# Patient Record
Sex: Female | Born: 1955 | Race: White | Hispanic: No | Marital: Married | Smoking: Never smoker
Health system: Southern US, Community
[De-identification: ages and names within clinical notes are randomized; demographics above are authoritative.]

## PROBLEM LIST (undated history)

## (undated) DIAGNOSIS — R319 Hematuria, unspecified: Secondary | ICD-10-CM

## (undated) DIAGNOSIS — S069XAA Unspecified intracranial injury with loss of consciousness status unknown, initial encounter: Secondary | ICD-10-CM

## (undated) DIAGNOSIS — S069X9A Unspecified intracranial injury with loss of consciousness of unspecified duration, initial encounter: Secondary | ICD-10-CM

## (undated) HISTORY — PX: OOPHORECTOMY: SHX86

## (undated) HISTORY — DX: Hematuria, unspecified: R31.9

## (undated) HISTORY — PX: CHOLECYSTECTOMY: SHX55

## (undated) HISTORY — PX: ABDOMINAL HYSTERECTOMY: SHX81

## (undated) HISTORY — PX: JOINT REPLACEMENT: SHX530

## (undated) HISTORY — PX: TONSILLECTOMY: SUR1361

---

## 2006-01-09 ENCOUNTER — Other Ambulatory Visit: Payer: Self-pay

## 2006-01-09 ENCOUNTER — Emergency Department: Payer: Self-pay | Admitting: Emergency Medicine

## 2007-10-22 ENCOUNTER — Ambulatory Visit: Payer: Self-pay | Admitting: Unknown Physician Specialty

## 2007-12-15 ENCOUNTER — Ambulatory Visit: Payer: Self-pay | Admitting: Gastroenterology

## 2008-07-05 ENCOUNTER — Ambulatory Visit: Payer: Self-pay | Admitting: Podiatry

## 2008-07-08 ENCOUNTER — Ambulatory Visit: Payer: Self-pay | Admitting: Podiatry

## 2009-03-17 ENCOUNTER — Emergency Department: Payer: Self-pay | Admitting: Emergency Medicine

## 2009-03-20 ENCOUNTER — Ambulatory Visit: Payer: Self-pay | Admitting: Family

## 2009-04-06 ENCOUNTER — Ambulatory Visit: Payer: Self-pay | Admitting: Unknown Physician Specialty

## 2010-04-16 ENCOUNTER — Ambulatory Visit: Payer: Self-pay | Admitting: Family Medicine

## 2011-05-09 ENCOUNTER — Ambulatory Visit: Payer: Self-pay | Admitting: Family Medicine

## 2012-07-14 ENCOUNTER — Ambulatory Visit: Payer: Self-pay

## 2014-03-31 DIAGNOSIS — S069XAA Unspecified intracranial injury with loss of consciousness status unknown, initial encounter: Secondary | ICD-10-CM

## 2014-03-31 HISTORY — DX: Unspecified intracranial injury with loss of consciousness status unknown, initial encounter: S06.9XAA

## 2014-04-12 ENCOUNTER — Emergency Department: Payer: Self-pay | Admitting: Emergency Medicine

## 2015-12-18 ENCOUNTER — Encounter: Payer: Self-pay | Admitting: *Deleted

## 2015-12-18 ENCOUNTER — Emergency Department: Payer: BLUE CROSS/BLUE SHIELD

## 2015-12-18 ENCOUNTER — Emergency Department
Admission: EM | Admit: 2015-12-18 | Discharge: 2015-12-18 | Disposition: A | Discharge status: Home / Self Care | Payer: BLUE CROSS/BLUE SHIELD | Attending: Emergency Medicine | Admitting: Emergency Medicine

## 2015-12-18 DIAGNOSIS — R197 Diarrhea, unspecified: Secondary | ICD-10-CM | POA: Diagnosis not present

## 2015-12-18 DIAGNOSIS — F1290 Cannabis use, unspecified, uncomplicated: Secondary | ICD-10-CM | POA: Diagnosis not present

## 2015-12-18 DIAGNOSIS — R51 Headache: Secondary | ICD-10-CM | POA: Diagnosis not present

## 2015-12-18 DIAGNOSIS — R1110 Vomiting, unspecified: Secondary | ICD-10-CM

## 2015-12-18 DIAGNOSIS — R112 Nausea with vomiting, unspecified: Secondary | ICD-10-CM | POA: Insufficient documentation

## 2015-12-18 HISTORY — DX: Unspecified intracranial injury with loss of consciousness of unspecified duration, initial encounter: S06.9X9A

## 2015-12-18 HISTORY — DX: Unspecified intracranial injury with loss of consciousness status unknown, initial encounter: S06.9XAA

## 2015-12-18 LAB — CBC WITH DIFFERENTIAL/PLATELET
BASOS ABS: 0.1 10*3/uL (ref 0–0.1)
BASOS PCT: 1 %
EOS ABS: 0.1 10*3/uL (ref 0–0.7)
EOS PCT: 1 %
HCT: 38.9 % (ref 35.0–47.0)
HEMOGLOBIN: 13.2 g/dL (ref 12.0–16.0)
Lymphocytes Relative: 21 %
Lymphs Abs: 1.5 10*3/uL (ref 1.0–3.6)
MCH: 28.9 pg (ref 26.0–34.0)
MCHC: 34 g/dL (ref 32.0–36.0)
MCV: 85.2 fL (ref 80.0–100.0)
Monocytes Absolute: 0.4 10*3/uL (ref 0.2–0.9)
Monocytes Relative: 6 %
NEUTROS PCT: 71 %
Neutro Abs: 5 10*3/uL (ref 1.4–6.5)
Platelets: 243 10*3/uL (ref 150–440)
RBC: 4.56 MIL/uL (ref 3.80–5.20)
RDW: 13.4 % (ref 11.5–14.5)
WBC: 7.1 10*3/uL (ref 3.6–11.0)

## 2015-12-18 LAB — COMPREHENSIVE METABOLIC PANEL
ALT: 19 U/L (ref 14–54)
AST: 18 U/L (ref 15–41)
Albumin: 4.1 g/dL (ref 3.5–5.0)
Alkaline Phosphatase: 69 U/L (ref 38–126)
Anion gap: 8 (ref 5–15)
BILIRUBIN TOTAL: 0.7 mg/dL (ref 0.3–1.2)
BUN: 9 mg/dL (ref 6–20)
CO2: 25 mmol/L (ref 22–32)
Calcium: 8.4 mg/dL — ABNORMAL LOW (ref 8.9–10.3)
Chloride: 107 mmol/L (ref 101–111)
Creatinine, Ser: 0.69 mg/dL (ref 0.44–1.00)
GFR calc Af Amer: >60 mL/min (ref >60)
Glucose, Bld: 145 mg/dL — ABNORMAL HIGH (ref 65–99)
POTASSIUM: 4 mmol/L (ref 3.5–5.1)
Sodium: 140 mmol/L (ref 135–145)
TOTAL PROTEIN: 6.8 g/dL (ref 6.5–8.1)

## 2015-12-18 LAB — LIPASE, BLOOD: LIPASE: 18 U/L (ref 11–51)

## 2015-12-18 MED ORDER — ONDANSETRON 4 MG PO TBDP: 4.0000 mg | ORAL_TABLET | Freq: Three times a day (TID) | ORAL | Status: DC | PRN

## 2015-12-18 MED ORDER — PROMETHAZINE HCL 25 MG/ML IJ SOLN
12.5000 mg | Freq: Once | INTRAMUSCULAR | Status: AC
  Administered 2015-12-18: 12.5 mg via INTRAVENOUS
  Filled 2015-12-18: qty 1

## 2015-12-18 MED ORDER — ONDANSETRON HCL 4 MG/2ML IJ SOLN
INTRAMUSCULAR | Status: AC
  Administered 2015-12-18: 8 mg
  Filled 2015-12-18: qty 4

## 2015-12-18 MED ORDER — FENTANYL CITRATE (PF) 100 MCG/2ML IJ SOLN
50.0000 ug | Freq: Once | INTRAMUSCULAR | Status: AC
  Administered 2015-12-18: 50 ug via INTRAVENOUS

## 2015-12-18 MED ORDER — SODIUM CHLORIDE 0.9 % IV BOLUS (SEPSIS)
1000.0000 mL | Freq: Once | INTRAVENOUS | Status: AC
  Administered 2015-12-18: 1000 mL via INTRAVENOUS

## 2015-12-18 MED ORDER — ONDANSETRON HCL 40 MG/20ML IJ SOLN: 8.0000 mg | Freq: Once | INTRAMUSCULAR | Status: DC

## 2015-12-18 MED ORDER — FENTANYL CITRATE (PF) 100 MCG/2ML IJ SOLN
INTRAMUSCULAR | Status: AC
  Administered 2015-12-18: 50 ug via INTRAVENOUS
  Filled 2015-12-18: qty 2

## 2015-12-18 NOTE — Discharge Instructions (Signed)

## 2015-12-18 NOTE — ED Notes (Signed)
Pt presents w/ c/o n/v/d starting after midnight tonight. Pt c/o headache. Pt states vomiting more times than could count and uncontrolled diarrhea. Pt states she started feeling nauseated after eating a salad that she purchased yesterday.

## 2015-12-18 NOTE — ED Provider Notes (Signed)
[facility] Emergency Department Provider Note  ____________________________________________  Time seen: 4:30 AM  I have reviewed the triage vital signs and the nursing notes.   HISTORY  Chief Complaint Vomiting     HPI Susan York is a 60 y.o. female presents with complaint of nausea roughly 2 hours after eating dinner last night which is persistent however no vomiting and at time patient stated that she went to bed and awoke this morning with nonbloody diarrhea with simultaneous vomiting multiple times. Patient denies any fever afebrile on presentation with a temperature 97.7. 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 She states that the headache followed the vomiting and diarrhea . Patient denies any abdominal pain     Past Medical History  Diagnosis Date  . TBI (traumatic brain injury) (HCC)     There are no active problems to display for this patient.   Past Surgical History  Procedure Laterality Date  . Cholecystectomy    . Joint replacement    . Abdominal hysterectomy    . Tonsillectomy      No current outpatient prescriptions on file.  Allergies Sulfa antibiotics  History reviewed. No pertinent family history.  Social History Social History  Substance Use Topics  . Smoking status: Never Smoker   . Smokeless tobacco: Never Used  . Alcohol Use: Yes     Comment: occasionally    Review of Systems  Constitutional: Negative for fever. Eyes: Negative for visual changes. ENT: Negative for sore throat. Cardiovascular: Negative for chest pain. Respiratory: Negative for shortness of breath. Gastrointestinal: Positive for vomiting and diarrhea Genitourinary: Negative for dysuria. Musculoskeletal: Negative for back pain. Skin: Negative for rash. Neurological: Positive for headaches   10-point ROS otherwise  negative.  ____________________________________________   PHYSICAL EXAM:  VITAL SIGNS: ED Triage Vitals  Enc Vitals Group     BP 12/18/15 0357 152/77 mmHg     Pulse Rate 12/18/15 0357 67     Resp 12/18/15 0357 16     Temp 12/18/15 0357 97.7 F (36.5 C)     Temp Source 12/18/15 0357 Oral     SpO2 12/18/15 0357 98 %     Weight 12/18/15 0357 172 lb (78.019 kg)     Height 12/18/15 0357  (1.676 m)     Head Cir --      Peak Flow --      Pain Score 12/18/15 0358 7     Pain Loc --      Pain Edu? --      Excl. in GC? --      Constitutional: Alert and oriented. Well appearing and in no distress. Eyes: Conjunctivae are normal. PERRL. Normal extraocular movements. ENT   Head: Normocephalic and atraumatic.   Nose: No congestion/rhinnorhea.   Mouth/Throat: Mucous membranes are moist.   Neck: No stridor. Hematological/Lymphatic/Immunilogical: No cervical lymphadenopathy. Cardiovascular: Normal rate, regular rhythm. Normal and symmetric distal pulses are present in all extremities. No murmurs, rubs, or gallops. Respiratory: Normal respiratory effort without tachypnea nor retractions. Breath sounds are clear and equal bilaterally. No wheezes/rales/rhonchi. Gastrointestinal: Soft and nontender. No distention. There is no CVA tenderness. Genitourinary: deferred Musculoskeletal: Nontender with normal range of motion in all extremities. No joint effusions.  No lower extremity tenderness nor edema. Neurologic:  Normal speech and language. No gross focal neurologic deficits are appreciated. Speech is normal.  Skin:  Skin  is warm, dry and intact. No rash noted. Psychiatric: Mood and affect are normal. Speech and behavior are normal. Patient exhibits appropriate insight and judgment.  ____________________________________________    LABS (pertinent positives/negatives)  Labs Reviewed  COMPREHENSIVE METABOLIC PANEL - Abnormal; Notable for the following:    Glucose, Bld 145  (*)    Calcium 8.4 (*)    All other components within normal limits  CBC WITH DIFFERENTIAL/PLATELET  LIPASE, BLOOD         RADIOLOGY   CT Head Wo Contrast (Final result) Result time: 12/18/15 05:53:24   Final result by Rad Results In Interface (12/18/15 05:53:24)   Narrative:   CLINICAL DATA: 60 year old female with persistent vomiting  EXAM: CT HEAD WITHOUT CONTRAST  TECHNIQUE: Contiguous axial images were obtained from the base of the skull through the vertex without intravenous contrast.  COMPARISON: Head CT dated 01/09/2006  FINDINGS: The ventricles and the sulci are appropriate in size for the patient's age. There is no intracranial hemorrhage. No midline shift or mass effect identified. The gray-white matter differentiation is preserved.  There is mild mucoperiosteal thickening of the paranasal sinuses. No air-fluid levels. The mastoid air cells are clear. The calvarium is intact.  IMPRESSION: No acute intracranial pathology.   Electronically Signed By: Elgie CollardArash Radparvar M.D. On: 12/18/2015 05:53        ECG Results        INITIAL IMPRESSION / ASSESSMENT AND PLAN / ED COURSE  Pertinent labs & imaging results that were available during my care of the patient were reviewed by me and considered in my medical decision making (see chart for details).  Patient received 8 mg of IV Zofran without any improvement in nausea and had one subsequent episode of vomiting. As such patient received 12.5 mg of Phenergan and on reassessment patient states that nausea was improved. Patient never had any abdominal pain during entire emergency department stay. Patient states that her headache is improved at this time as well.Given absence of abdominal pain no abdominal imaging was performed laboratory data also reassuring. Suspect possible gastroenteritis as the etiologies of patient's vomiting and diarrhea  ____________________________________________   FINAL  CLINICAL IMPRESSION(S) / ED DIAGNOSES  Final diagnoses:  Vomiting and diarrhea      Darci Currentandolph N Brown, MD 12/18/15 [phone]

## 2016-05-06 ENCOUNTER — Other Ambulatory Visit: Payer: Self-pay | Admitting: Family Medicine

## 2016-05-06 DIAGNOSIS — Z1231 Encounter for screening mammogram for malignant neoplasm of breast: Secondary | ICD-10-CM

## 2016-05-14 ENCOUNTER — Encounter (INDEPENDENT_AMBULATORY_CARE_PROVIDER_SITE_OTHER): Payer: Self-pay

## 2016-05-14 ENCOUNTER — Ambulatory Visit
Admission: RE | Admit: 2016-05-14 | Discharge: 2016-05-14 | Disposition: A | Discharge status: Home / Self Care | Payer: BLUE CROSS/BLUE SHIELD | Source: Ambulatory Visit | Attending: Family Medicine | Admitting: Family Medicine

## 2016-05-14 DIAGNOSIS — Z1231 Encounter for screening mammogram for malignant neoplasm of breast: Secondary | ICD-10-CM

## 2017-03-04 ENCOUNTER — Other Ambulatory Visit: Payer: Self-pay | Admitting: Internal Medicine

## 2017-03-04 DIAGNOSIS — Z1231 Encounter for screening mammogram for malignant neoplasm of breast: Secondary | ICD-10-CM

## 2017-06-09 ENCOUNTER — Ambulatory Visit: Payer: Medicare Other

## 2017-06-13 ENCOUNTER — Ambulatory Visit: Payer: Medicare Other

## 2017-07-17 ENCOUNTER — Other Ambulatory Visit: Payer: Self-pay | Admitting: Family Medicine

## 2017-07-17 DIAGNOSIS — Z1231 Encounter for screening mammogram for malignant neoplasm of breast: Secondary | ICD-10-CM

## 2017-07-29 ENCOUNTER — Ambulatory Visit
Admission: RE | Admit: 2017-07-29 | Discharge: 2017-07-29 | Disposition: A | Discharge status: Home / Self Care | Payer: Medicare HMO | Source: Ambulatory Visit | Attending: Family Medicine | Admitting: Family Medicine

## 2017-07-29 DIAGNOSIS — Z1231 Encounter for screening mammogram for malignant neoplasm of breast: Secondary | ICD-10-CM | POA: Diagnosis present

## 2017-08-04 ENCOUNTER — Other Ambulatory Visit: Payer: Self-pay | Admitting: Family Medicine

## 2017-08-04 DIAGNOSIS — R928 Other abnormal and inconclusive findings on diagnostic imaging of breast: Secondary | ICD-10-CM

## 2017-08-04 DIAGNOSIS — N6489 Other specified disorders of breast: Secondary | ICD-10-CM

## 2017-08-11 ENCOUNTER — Ambulatory Visit: Payer: Medicare HMO

## 2017-08-11 ENCOUNTER — Other Ambulatory Visit: Payer: Medicare HMO

## 2017-08-20 ENCOUNTER — Ambulatory Visit
Admission: RE | Admit: 2017-08-20 | Discharge: 2017-08-20 | Disposition: A | Discharge status: Home / Self Care | Payer: Medicare HMO | Source: Ambulatory Visit | Attending: Family Medicine | Admitting: Family Medicine

## 2017-08-20 DIAGNOSIS — R928 Other abnormal and inconclusive findings on diagnostic imaging of breast: Secondary | ICD-10-CM | POA: Diagnosis present

## 2017-08-20 DIAGNOSIS — N6489 Other specified disorders of breast: Secondary | ICD-10-CM

## 2017-08-20 DIAGNOSIS — N6311 Unspecified lump in the right breast, upper outer quadrant: Secondary | ICD-10-CM | POA: Diagnosis not present

## 2017-08-25 ENCOUNTER — Other Ambulatory Visit: Payer: Self-pay | Admitting: Family Medicine

## 2017-08-25 DIAGNOSIS — N6310 Unspecified lump in the right breast, unspecified quadrant: Secondary | ICD-10-CM

## 2017-08-25 DIAGNOSIS — R928 Other abnormal and inconclusive findings on diagnostic imaging of breast: Secondary | ICD-10-CM

## 2017-08-28 ENCOUNTER — Other Ambulatory Visit: Payer: Self-pay | Admitting: Diagnostic Radiology

## 2017-08-28 ENCOUNTER — Ambulatory Visit
Admission: RE | Admit: 2017-08-28 | Discharge: 2017-08-28 | Disposition: A | Discharge status: Home / Self Care | Payer: Medicare HMO | Source: Ambulatory Visit | Attending: Family Medicine | Admitting: Family Medicine

## 2017-08-28 DIAGNOSIS — N6001 Solitary cyst of right breast: Secondary | ICD-10-CM | POA: Diagnosis not present

## 2017-08-28 DIAGNOSIS — R928 Other abnormal and inconclusive findings on diagnostic imaging of breast: Secondary | ICD-10-CM

## 2017-08-28 DIAGNOSIS — N6311 Unspecified lump in the right breast, upper outer quadrant: Secondary | ICD-10-CM | POA: Insufficient documentation

## 2017-08-28 DIAGNOSIS — N6310 Unspecified lump in the right breast, unspecified quadrant: Secondary | ICD-10-CM

## 2017-08-28 HISTORY — PX: BREAST BIOPSY: SHX20

## 2017-09-01 LAB — SURGICAL PATHOLOGY

## 2018-08-06 ENCOUNTER — Other Ambulatory Visit: Payer: Self-pay | Admitting: Family Medicine

## 2018-08-06 DIAGNOSIS — Z1231 Encounter for screening mammogram for malignant neoplasm of breast: Secondary | ICD-10-CM

## 2018-08-20 ENCOUNTER — Ambulatory Visit
Admission: RE | Admit: 2018-08-20 | Discharge: 2018-08-20 | Disposition: A | Discharge status: Home / Self Care | Payer: Medicare HMO | Source: Ambulatory Visit | Attending: Family Medicine | Admitting: Family Medicine

## 2018-08-20 DIAGNOSIS — Z1231 Encounter for screening mammogram for malignant neoplasm of breast: Secondary | ICD-10-CM | POA: Diagnosis not present

## 2019-05-06 IMAGING — MG MM DIGITAL DIAGNOSTIC UNILAT*R* W/ TOMO W/ CAD
6 of 9 series · 6 of 21 positions shown · non-contrast
Comparison: Previous exam(s).

CLINICAL DATA: Right breast upper outer quadrant possible nodule
seen on most recent screening mammography.

EXAM:
DIGITAL DIAGNOSTIC RIGHT MAMMOGRAM WITH CAD AND TOMO
ULTRASOUND RIGHT BREAST

[R MLO synth-2D]
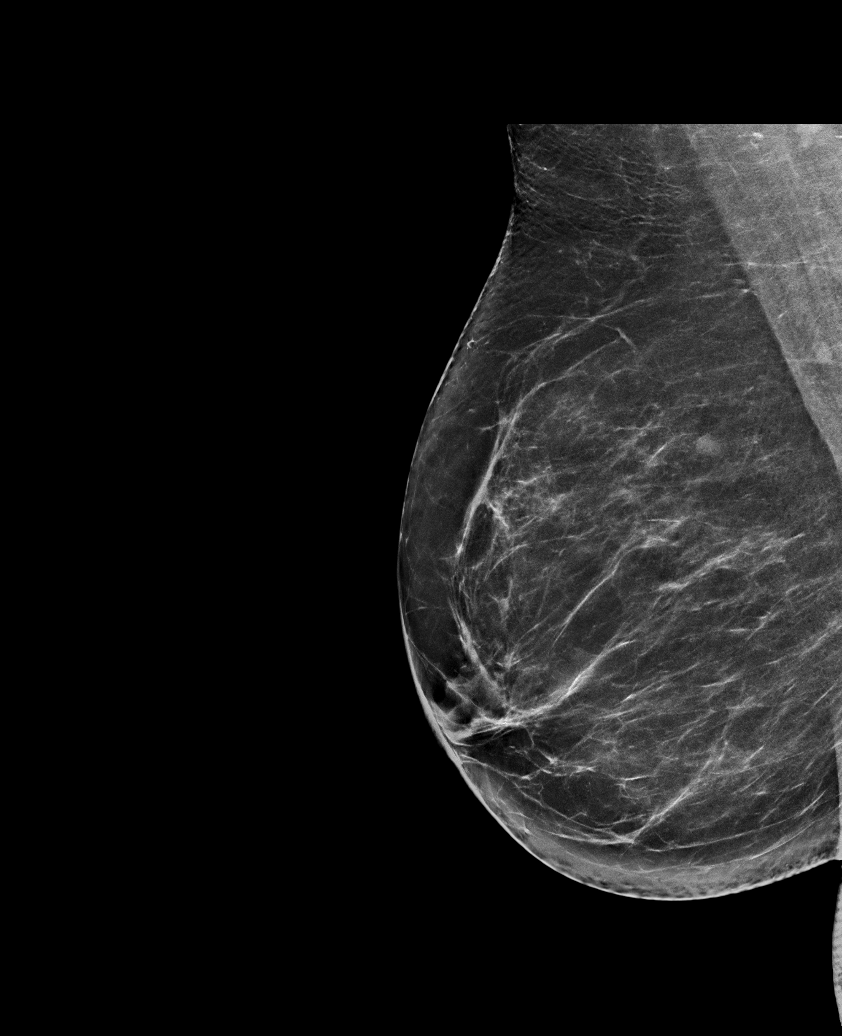

[R XCCL]
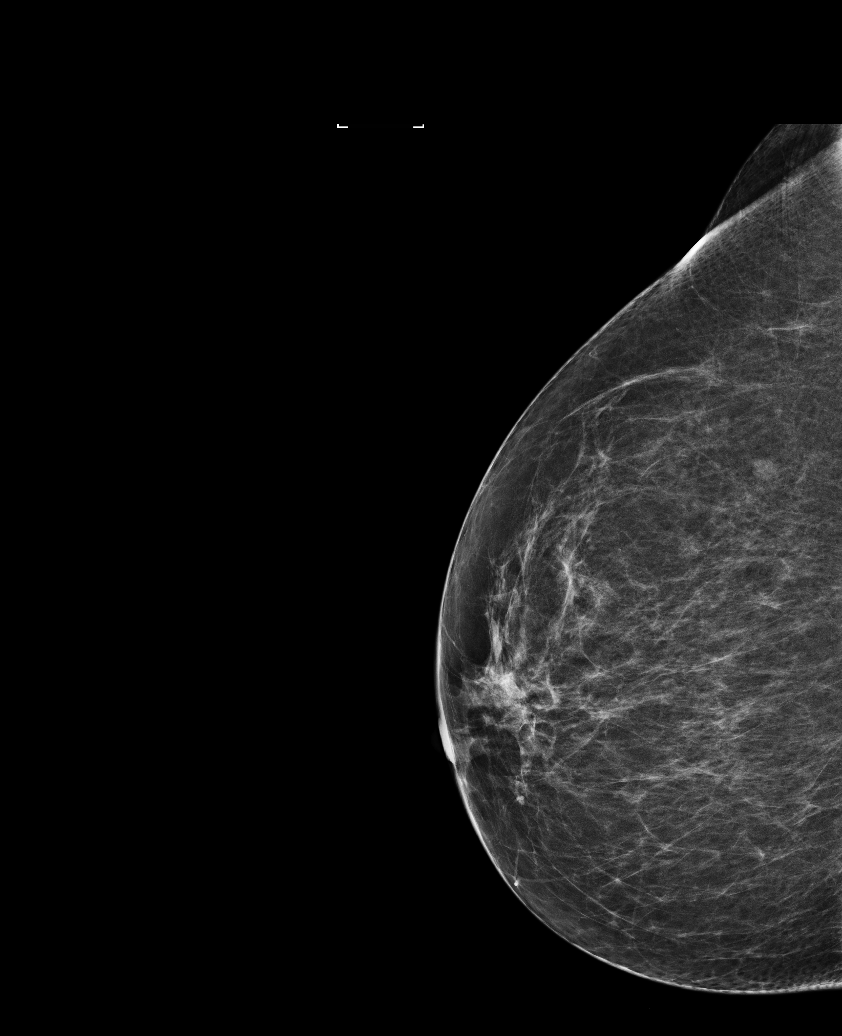

[R MLO]
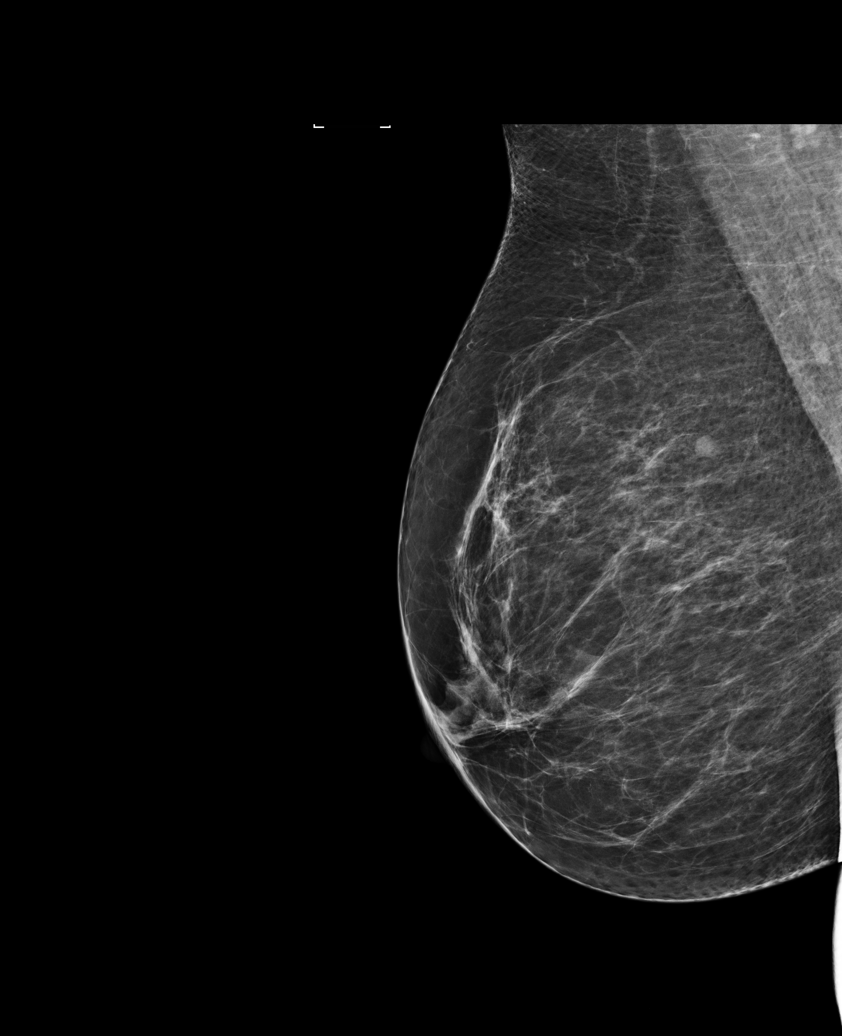

[R XCCL synth-2D]
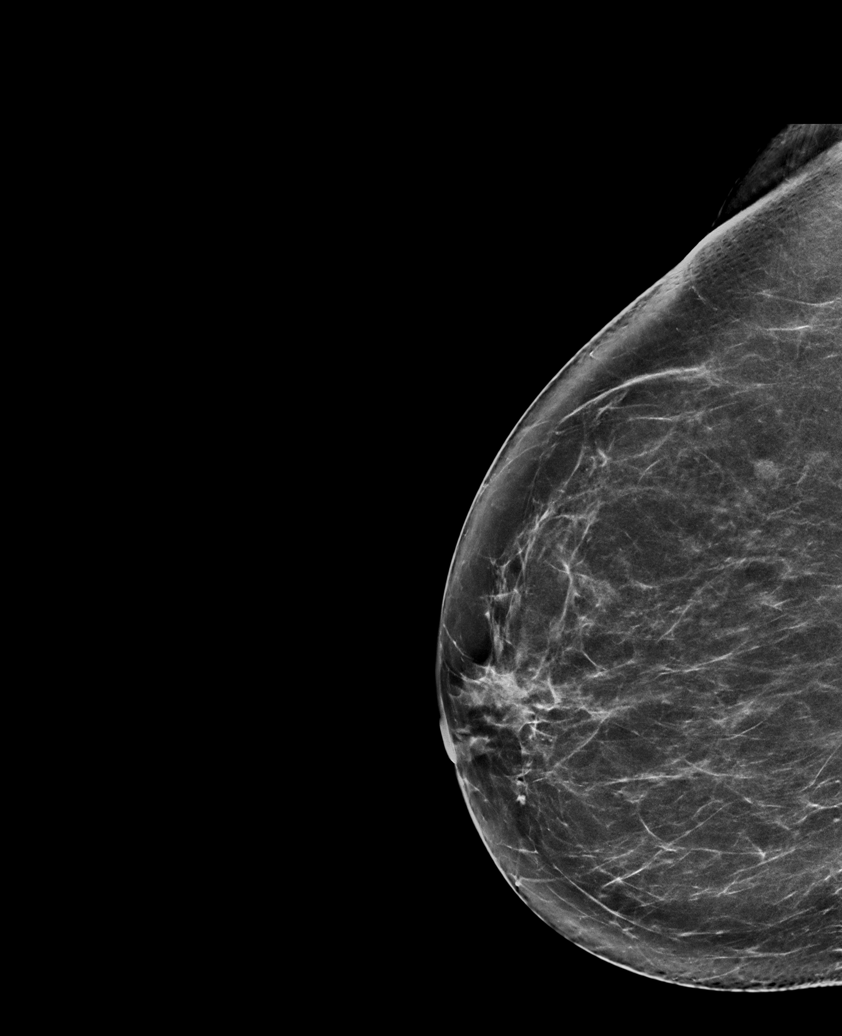

[R CC synth-2D]
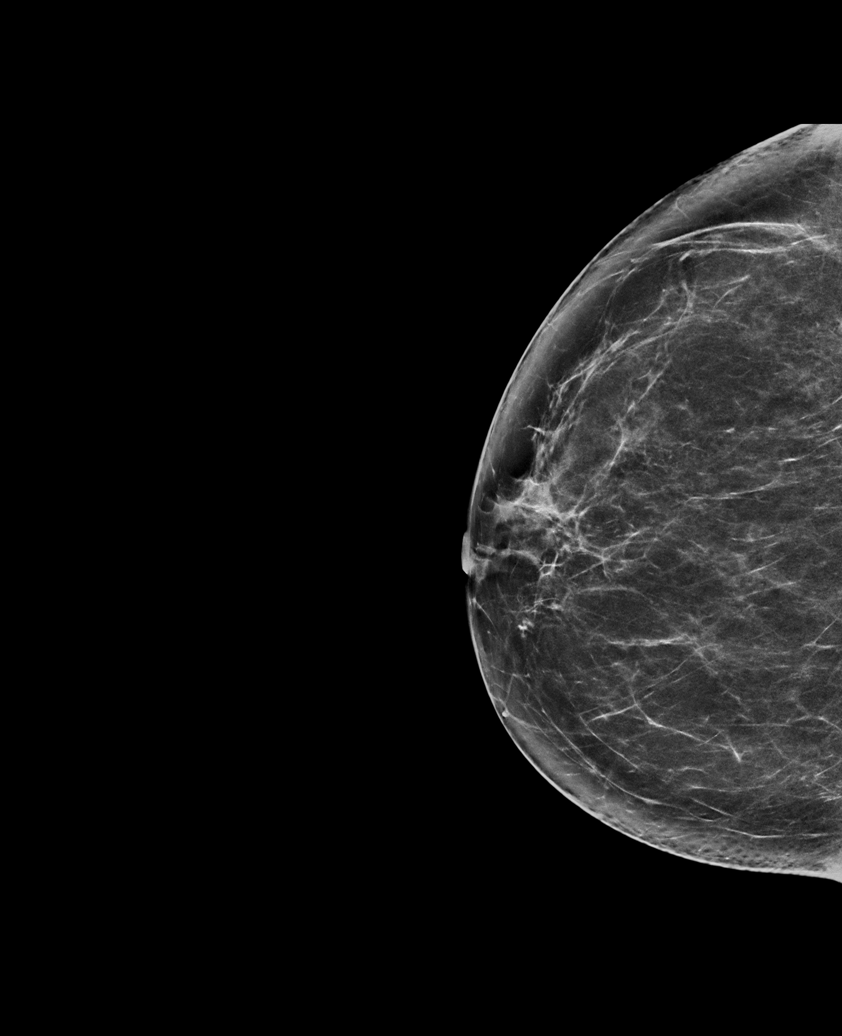

[R CC]
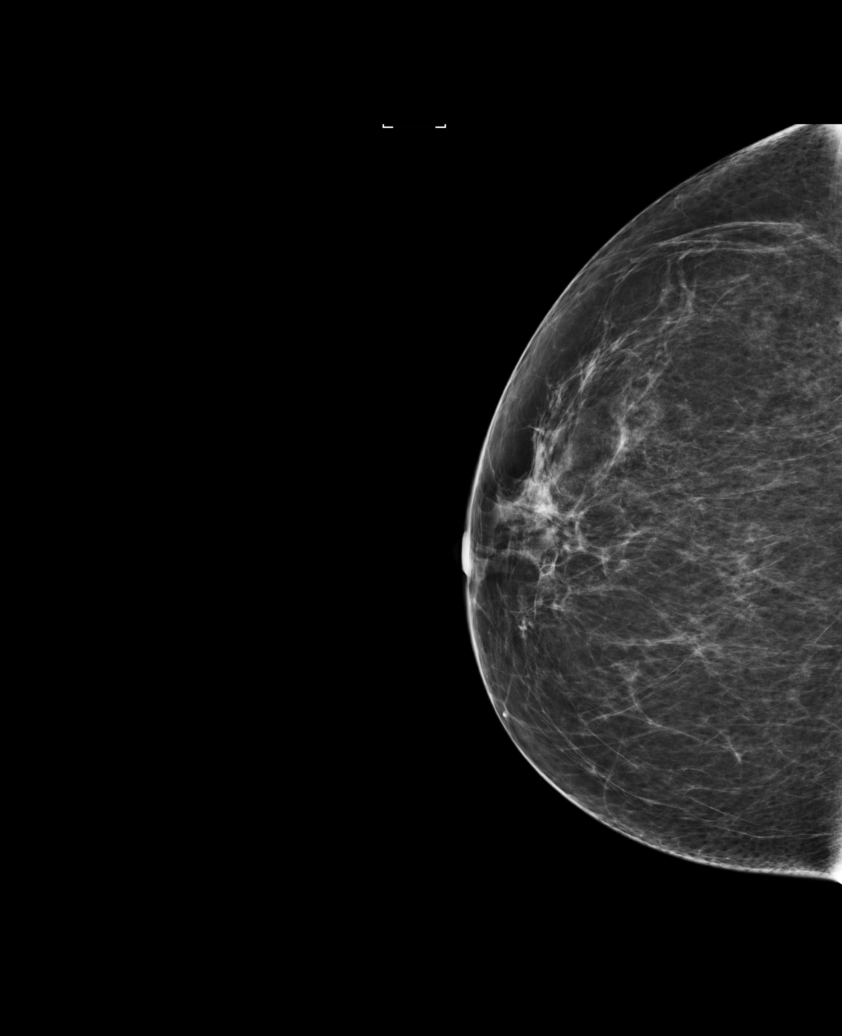

[6 of 21 positions shown; findings below may reference images not displayed]

ACR Breast Density Category b: There are scattered areas of
fibroglandular density.
FINDINGS: Additional mammographic views of the right breast demonstrate
persistent 7 mm circumscribed nodule in the right breast upper outer
quadrant, posterior depth.

Mammographic images were processed with CAD.

On physical exam, no suspicious masses are palpated.

Targeted ultrasound is performed, showing right breast 10 o'clock 9
cm from the nipple hypoechoic slightly taller than wide nodule
measuring 0.6 x 0.4 by 0.4 cm. This finding corresponds to the
mammographically seen nodule. No evidence of right axillary
lymphadenopathy.
IMPRESSION: Right breast 10 o'clock 6 mm indeterminate nodule, for which
ultrasound-guided core needle biopsy is recommended.

RECOMMENDATION:
Ultrasound-guided core needle biopsy of the right breast.

I have discussed the findings and recommendations with the patient.
Results were also provided in writing at the conclusion of the
visit. If applicable, a reminder letter will be sent to the patient
regarding the next appointment.

BI-RADS CATEGORY  4: Suspicious.

## 2019-08-16 ENCOUNTER — Other Ambulatory Visit: Payer: Self-pay | Admitting: Hematology and Oncology

## 2019-08-16 DIAGNOSIS — Z1231 Encounter for screening mammogram for malignant neoplasm of breast: Secondary | ICD-10-CM

## 2019-09-13 ENCOUNTER — Other Ambulatory Visit: Payer: Self-pay

## 2019-09-13 ENCOUNTER — Ambulatory Visit
Admission: RE | Admit: 2019-09-13 | Discharge: 2019-09-13 | Disposition: A | Discharge status: Home / Self Care | Payer: Medicare HMO | Source: Ambulatory Visit | Attending: Hematology and Oncology | Admitting: Hematology and Oncology

## 2019-09-13 DIAGNOSIS — Z1231 Encounter for screening mammogram for malignant neoplasm of breast: Secondary | ICD-10-CM

## 2020-09-19 ENCOUNTER — Other Ambulatory Visit: Payer: Self-pay | Admitting: Family Medicine

## 2020-09-19 DIAGNOSIS — Z1231 Encounter for screening mammogram for malignant neoplasm of breast: Secondary | ICD-10-CM

## 2020-09-26 ENCOUNTER — Ambulatory Visit: Payer: Medicare HMO

## 2020-10-02 ENCOUNTER — Other Ambulatory Visit: Payer: Self-pay

## 2020-10-02 ENCOUNTER — Ambulatory Visit
Admission: RE | Admit: 2020-10-02 | Discharge: 2020-10-02 | Disposition: A | Discharge status: Home / Self Care | Payer: Medicare HMO | Source: Ambulatory Visit | Attending: Family Medicine | Admitting: Family Medicine

## 2020-10-02 DIAGNOSIS — Z1231 Encounter for screening mammogram for malignant neoplasm of breast: Secondary | ICD-10-CM | POA: Insufficient documentation

## 2021-09-10 ENCOUNTER — Other Ambulatory Visit: Payer: Self-pay | Admitting: Family Medicine

## 2021-09-10 DIAGNOSIS — Z1231 Encounter for screening mammogram for malignant neoplasm of breast: Secondary | ICD-10-CM

## 2021-10-18 ENCOUNTER — Ambulatory Visit
Admission: RE | Admit: 2021-10-18 | Discharge: 2021-10-18 | Disposition: A | Discharge status: Home / Self Care | Payer: Medicare HMO | Source: Ambulatory Visit | Attending: Family Medicine | Admitting: Family Medicine

## 2021-10-18 DIAGNOSIS — Z1231 Encounter for screening mammogram for malignant neoplasm of breast: Secondary | ICD-10-CM

## 2021-11-21 ENCOUNTER — Encounter: Payer: Self-pay | Admitting: Gastroenterology

## 2021-11-21 ENCOUNTER — Other Ambulatory Visit: Payer: Self-pay

## 2021-11-21 DIAGNOSIS — Z86010 Personal history of colonic polyps: Secondary | ICD-10-CM

## 2021-11-21 MED ORDER — NA SULFATE-K SULFATE-MG SULF 17.5-3.13-1.6 GM/177ML PO SOLN
1.0000 | Freq: Once | ORAL | 0 refills | Status: AC
Start: 2021-11-21 — End: 2021-11-21

## 2021-11-21 NOTE — Progress Notes (Signed)
Gastroenterology Pre-Procedure Review  Request Date: 12/03/2021 Requesting Physician: Dr. Servando Snare  PATIENT REVIEW QUESTIONS: The patient responded to the following health history questions as indicated:    1. Are you having any GI issues? no 2. Do you have a personal history of Polyps? yes (last colonoscopy y) 3. Do you have a family history of Colon Cancer or Polyps? no 4. Diabetes Mellitus? no 5. Joint replacements in the past 12 months?no 6. Major health problems in the past 3 months?no 7. Any artificial heart valves, MVP, or defibrillator?no    MEDICATIONS & ALLERGIES:    Patient reports the following regarding taking any anticoagulation/antiplatelet therapy:   Plavix, Coumadin, Eliquis, Xarelto, Lovenox, Pradaxa, Brilinta, or Effient? no Aspirin? no  Patient confirms/reports the following medications:  Current Outpatient Medications  Medication Sig Dispense Refill   ondansetron (ZOFRAN ODT) 4 MG disintegrating tablet Take 1 tablet (4 mg total) by mouth every 8 (eight) hours as needed for nausea or vomiting. 20 tablet 0   No current facility-administered medications for this visit.    Patient confirms/reports the following allergies:  Allergies  Allergen Reactions   Sulfa Antibiotics Anaphylaxis    No orders of the defined types were placed in this encounter.   AUTHORIZATION INFORMATION Primary Insurance: 1D#: Group #:  Secondary Insurance: 1D#: Group #:  SCHEDULE INFORMATION: Date: 12/03/2021 Time: Location:[facility]

## 2021-11-27 ENCOUNTER — Telehealth: Payer: Self-pay

## 2021-11-27 MED ORDER — PEG 3350-KCL-NA BICARB-NACL 420 G PO SOLR: 4000.0000 mL | Freq: Once | ORAL | 0 refills | Status: AC

## 2021-11-27 NOTE — Telephone Encounter (Signed)
Resent in different prep to pharmacy  and called patient and let her know it was sent in

## 2021-12-03 ENCOUNTER — Encounter: Payer: Self-pay | Admitting: Gastroenterology

## 2021-12-03 ENCOUNTER — Ambulatory Visit: Payer: Medicare HMO | Admitting: Anesthesiology

## 2021-12-03 ENCOUNTER — Other Ambulatory Visit: Payer: Self-pay

## 2021-12-03 ENCOUNTER — Encounter
Admission: RE | Disposition: A | Discharge status: Home / Self Care | Payer: Self-pay | Source: Home / Self Care | Attending: Gastroenterology

## 2021-12-03 ENCOUNTER — Ambulatory Visit
Admission: RE | Admit: 2021-12-03 | Discharge: 2021-12-03 | Disposition: A | Discharge status: Home / Self Care | Payer: Medicare HMO | Attending: Gastroenterology | Admitting: Gastroenterology

## 2021-12-03 DIAGNOSIS — K640 First degree hemorrhoids: Secondary | ICD-10-CM | POA: Insufficient documentation

## 2021-12-03 DIAGNOSIS — Z860100 Personal history of colon polyps, unspecified: Secondary | ICD-10-CM

## 2021-12-03 DIAGNOSIS — Z1211 Encounter for screening for malignant neoplasm of colon: Secondary | ICD-10-CM | POA: Insufficient documentation

## 2021-12-03 HISTORY — PX: COLONOSCOPY WITH PROPOFOL: SHX5780

## 2021-12-03 SURGERY — COLONOSCOPY WITH PROPOFOL
Anesthesia: General

## 2021-12-03 MED ORDER — LIDOCAINE HCL (CARDIAC) PF 100 MG/5ML IV SOSY
PREFILLED_SYRINGE | INTRAVENOUS | Status: DC | PRN
  Administered 2021-12-03: 40 mg via INTRAVENOUS

## 2021-12-03 MED ORDER — PROPOFOL 10 MG/ML IV BOLUS
INTRAVENOUS | Status: DC | PRN
  Administered 2021-12-03: 90 mg via INTRAVENOUS

## 2021-12-03 MED ORDER — SODIUM CHLORIDE 0.9 % IV SOLN: INTRAVENOUS | Status: DC

## 2021-12-03 MED ORDER — PROPOFOL 500 MG/50ML IV EMUL
INTRAVENOUS | Status: DC | PRN
  Administered 2021-12-03: 150 ug/kg/min via INTRAVENOUS

## 2021-12-03 NOTE — Transfer of Care (Signed)
Immediate Anesthesia Transfer of Care Note  Patient: Susan York  Procedure(s) Performed: Procedure(s): COLONOSCOPY WITH PROPOFOL (N/A)  Patient Location: PACU and Endoscopy Unit  Anesthesia Type:General  Level of Consciousness: sedated  Airway & Oxygen Therapy: Patient Spontanous Breathing and Patient connected to nasal cannula oxygen  Post-op Assessment: Report given to RN and Post -op Vital signs reviewed and stable  Post vital signs: Reviewed and stable  Last Vitals:  Vitals:   12/03/21 1022 12/03/21 1130  BP: 130/76 127/66  Pulse: 87   Resp: 20   Temp: (!) 35.9 C (!) 35.9 C  SpO2: 99% 99%    Complications: No apparent anesthesia complications

## 2021-12-03 NOTE — Op Note (Signed)
[facility] Gastroenterology Patient Name: Susan ParadiseSheila York Procedure Date: 12/03/2021 11:06 AM MRN: [id number] Account #: [id number] Date of Birth: 10/27/1955 Admit Type: Outpatient Age: [age] [facility] Gender: Female Note Status: Finalized Instrument Name: Nelda MarseilleColonscope [id number] Procedure:             Colonoscopy Indications:           High risk colon cancer surveillance: Personal history                         of colonic polyps Providers:             Midge Miniumarren Candace Begue MD, MD Referring MD:          Troy SineANNE ROBISON MD Medicines:             Propofol per Anesthesia Complications:         No immediate complications. Procedure:             Pre-Anesthesia Assessment:                        - Prior to the procedure, a History and Physical was                         performed, and patient medications and allergies were                         reviewed. The patient's tolerance of previous                         anesthesia was also reviewed. The risks and benefits                         of the procedure and the sedation options and risks                         were discussed with the patient. All questions were                         answered, and informed consent was obtained. Prior                         Anticoagulants: The patient has taken no previous                         anticoagulant or antiplatelet agents. ASA Grade                         Assessment: II - A patient with mild systemic disease.                         After reviewing the risks and benefits, the patient                         was deemed in satisfactory condition to undergo the                         procedure.  After obtaining informed consent, the colonoscope was                         passed under direct vision. Throughout the procedure,                         the patient's blood pressure, pulse, and oxygen                         saturations were monitored continuously.  The                         Colonoscope was introduced through the anus and                         advanced to the the cecum, identified by appendiceal                         orifice and ileocecal valve. The colonoscopy was                         performed without difficulty. The patient tolerated                         the procedure well. The quality of the bowel                         preparation was excellent. Findings:      The perianal and digital rectal examinations were normal.      Non-bleeding internal hemorrhoids were found during retroflexion. The       hemorrhoids were Grade I (internal hemorrhoids that do not prolapse). Impression:            - Non-bleeding internal hemorrhoids.                        - No specimens collected. Recommendation:        - Discharge patient to home.                        - Resume previous diet.                        - Repeat colonoscopy in 7 years for surveillance. Procedure Code(s):     --- Professional ---                        [phone], Colonoscopy, flexible; diagnostic, including                         collection of specimen(s) by brushing or washing, when                         performed (separate procedure) Diagnosis Code(s):     --- Professional ---                        Z86.010, Personal history of colonic polyps CPT copyright 2019 American Medical Association. All rights reserved. The codes documented in this report are preliminary and upon coder review may  be revised to meet  current compliance requirements. Midge Minium MD, MD 12/03/2021 11:27:22 AM This report has been signed electronically. Number of Addenda: 0 Note Initiated On: 12/03/2021 11:06 AM Scope Withdrawal Time: 0 hours 8 minutes 48 seconds  Total Procedure Duration: 0 hours 14 minutes 14 seconds  Estimated Blood Loss:  Estimated blood loss: none.      [facility]

## 2021-12-03 NOTE — Anesthesia Procedure Notes (Signed)
Date/Time: 12/03/2021 11:09 AM Performed by: Stormy Fabian, CRNA Pre-anesthesia Checklist: Patient identified, Emergency Drugs available, Suction available and Patient being monitored Patient Re-evaluated:Patient Re-evaluated prior to induction Oxygen Delivery Method: Nasal cannula Induction Type: IV induction Dental Injury: Teeth and Oropharynx as per pre-operative assessment  Comments: Nasal cannula with etCO2 monitoring
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
## 2021-12-04 ENCOUNTER — Encounter: Payer: Self-pay | Admitting: Gastroenterology

## 2021-12-04 NOTE — Anesthesia Postprocedure Evaluation (Signed)
Anesthesia Post Note  Patient: Susan York  Procedure(s) Performed: COLONOSCOPY WITH PROPOFOL  Patient location during evaluation: PACU Anesthesia Type: General Level of consciousness: awake and alert Pain management: pain level controlled Vital Signs Assessment: post-procedure vital signs reviewed and stable Respiratory status: spontaneous breathing, nonlabored ventilation, respiratory function stable and patient connected to nasal cannula oxygen Cardiovascular status: blood pressure returned to baseline and stable Postop Assessment: no apparent nausea or vomiting Anesthetic complications: no   No notable events documented.   Last Vitals:  Vitals:   12/03/21 1130 12/03/21 1150  BP: 127/66 (!) 134/57  Pulse:  74  Resp:  (!) 21  Temp: (!) 35.9 C   SpO2: 99% 99%    Last Pain:  Vitals:   12/03/21 1150  TempSrc:   PainSc: 0-No pain                 Molli Barrows

## 2022-05-09 ENCOUNTER — Ambulatory Visit (INDEPENDENT_AMBULATORY_CARE_PROVIDER_SITE_OTHER): Payer: Medicare HMO

## 2022-05-09 ENCOUNTER — Encounter: Payer: Self-pay | Admitting: Emergency Medicine

## 2022-05-09 ENCOUNTER — Ambulatory Visit
Admission: EM | Admit: 2022-05-09 | Discharge: 2022-05-09 | Disposition: A | Discharge status: Home / Self Care | Payer: Medicare HMO | Attending: Family Medicine | Admitting: Family Medicine

## 2022-05-09 DIAGNOSIS — K2900 Acute gastritis without bleeding: Secondary | ICD-10-CM | POA: Insufficient documentation

## 2022-05-09 DIAGNOSIS — R112 Nausea with vomiting, unspecified: Secondary | ICD-10-CM

## 2022-05-09 LAB — CBC WITH DIFFERENTIAL/PLATELET
Abs Immature Granulocytes: 0.03 10*3/uL (ref 0.00–0.07)
Basophils Absolute: 0.1 10*3/uL (ref 0.0–0.1)
Basophils Relative: 1 %
Eosinophils Absolute: 0.1 10*3/uL (ref 0.0–0.5)
Eosinophils Relative: 1 %
HCT: 42.2 % (ref 36.0–46.0)
Hemoglobin: 13.9 g/dL (ref 12.0–15.0)
Immature Granulocytes: 0 %
Lymphocytes Relative: 29 %
Lymphs Abs: 2.5 10*3/uL (ref 0.7–4.0)
MCH: 29 pg (ref 26.0–34.0)
MCHC: 32.9 g/dL (ref 30.0–36.0)
MCV: 87.9 fL (ref 80.0–100.0)
Monocytes Absolute: 0.5 10*3/uL (ref 0.1–1.0)
Monocytes Relative: 6 %
Neutro Abs: 5.5 10*3/uL (ref 1.7–7.7)
Neutrophils Relative %: 63 %
Platelets: 324 10*3/uL (ref 150–400)
RBC: 4.8 MIL/uL (ref 3.87–5.11)
RDW: 12.8 % (ref 11.5–15.5)
WBC: 8.6 10*3/uL (ref 4.0–10.5)
nRBC: 0 % (ref 0.0–0.2)

## 2022-05-09 LAB — COMPREHENSIVE METABOLIC PANEL
ALT: 14 U/L (ref 0–44)
AST: 17 U/L (ref 15–41)
Albumin: 4 g/dL (ref 3.5–5.0)
Alkaline Phosphatase: 78 U/L (ref 38–126)
Anion gap: 5 (ref 5–15)
BUN: 15 mg/dL (ref 8–23)
CO2: 29 mmol/L (ref 22–32)
Calcium: 8.8 mg/dL — ABNORMAL LOW (ref 8.9–10.3)
Chloride: 107 mmol/L (ref 98–111)
Creatinine, Ser: 0.88 mg/dL (ref 0.44–1.00)
GFR, Estimated: >60 mL/min (ref >60)
Glucose, Bld: 94 mg/dL (ref 70–99)
Potassium: 4.6 mmol/L (ref 3.5–5.1)
Sodium: 141 mmol/L (ref 135–145)
Total Bilirubin: 0.5 mg/dL (ref 0.3–1.2)
Total Protein: 6.9 g/dL (ref 6.5–8.1)

## 2022-05-09 LAB — LIPASE, BLOOD: Lipase: 112 U/L — ABNORMAL HIGH (ref 11–51)

## 2022-05-09 MED ORDER — PROMETHAZINE HCL 25 MG/ML IJ SOLN
12.5000 mg | Freq: Once | INTRAMUSCULAR | Status: AC
  Administered 2022-05-09: 12.5 mg via INTRAMUSCULAR

## 2022-05-09 MED ORDER — IOHEXOL 300 MG/ML  SOLN
100.0000 mL | Freq: Once | INTRAMUSCULAR | Status: AC | PRN
Start: 2022-05-09 — End: 2022-05-09
  Administered 2022-05-09: 100 mL via INTRAVENOUS

## 2022-05-09 MED ORDER — PROMETHAZINE HCL 12.5 MG PO TABS: 25.0000 mg | ORAL_TABLET | Freq: Four times a day (QID) | ORAL | 0 refills | Status: DC | PRN

## 2022-05-09 NOTE — ED Triage Notes (Signed)
Pt c/o nausea and intermittent vomiting x 1 week. She also developed lower back pain and denies any urinary symptoms x 1 week.
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
## 2022-05-09 NOTE — Discharge Instructions (Addendum)
Your CT showed evidence of inflammation of your stomach.  This is likely the cause of your persistent nausea.  I would drink clear liquids over the next 1 to 2 days.  Gradually increase food items in your diet as tolerated by your nausea pain.  Go to ED for red flag symptoms, including; fevers you cannot reduce with Tylenol/Motrin, severe headaches, vision changes, numbness/weakness in part of the body, lethargy, confusion, intractable vomiting, severe dehydration, chest pain, breathing difficulty, severe persistent abdominal or pelvic pain, signs of severe infection (increased redness, swelling of an area), feeling faint or passing out, dizziness, etc. You should especially go to the ED for sudden acute worsening of condition if you do not elect to go at this time.

## 2022-06-04 ENCOUNTER — Ambulatory Visit: Payer: Medicare HMO | Admitting: Urology

## 2022-06-04 ENCOUNTER — Encounter: Payer: Self-pay | Admitting: Urology

## 2022-06-04 ENCOUNTER — Other Ambulatory Visit: Payer: Self-pay | Admitting: *Deleted

## 2022-06-04 ENCOUNTER — Other Ambulatory Visit
Admission: RE | Admit: 2022-06-04 | Discharge: 2022-06-04 | Disposition: A | Discharge status: Home / Self Care | Payer: Medicare HMO | Attending: Urology | Admitting: Urology

## 2022-06-04 VITALS — BP 137/83 | HR 80 | Ht 66.0 in | Wt 164.0 lb

## 2022-06-04 DIAGNOSIS — R319 Hematuria, unspecified: Secondary | ICD-10-CM | POA: Insufficient documentation

## 2022-06-04 DIAGNOSIS — R3121 Asymptomatic microscopic hematuria: Secondary | ICD-10-CM

## 2022-06-04 LAB — URINALYSIS, COMPLETE (UACMP) WITH MICROSCOPIC
Glucose, UA: NEGATIVE mg/dL
Ketones, ur: 15 mg/dL — AB
Nitrite: NEGATIVE
Protein, ur: 30 mg/dL — AB
Specific Gravity, Urine: 1.025 (ref 1.005–1.030)
pH: 5.5 (ref 5.0–8.0)

## 2022-06-04 NOTE — Patient Instructions (Signed)
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
## 2022-06-05 LAB — URINE CULTURE: Culture: NO GROWTH

## 2022-06-11 ENCOUNTER — Other Ambulatory Visit: Payer: Self-pay

## 2022-06-11 ENCOUNTER — Other Ambulatory Visit: Payer: Medicare HMO | Admitting: Urology

## 2022-06-11 ENCOUNTER — Other Ambulatory Visit: Payer: Self-pay | Admitting: Urology

## 2022-06-11 DIAGNOSIS — F418 Other specified anxiety disorders: Secondary | ICD-10-CM

## 2022-06-11 MED ORDER — DIAZEPAM 5 MG PO TABS: 5.0000 mg | ORAL_TABLET | Freq: Once | ORAL | 0 refills | Status: AC | PRN

## 2022-06-12 ENCOUNTER — Telehealth: Payer: Self-pay | Admitting: Urology
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
## 2022-06-14 NOTE — Telephone Encounter (Signed)
Patient needs to contact her PCP to schedule f/u for anxiety.

## 2022-06-18 ENCOUNTER — Encounter: Payer: Self-pay | Admitting: Urology

## 2022-06-18 ENCOUNTER — Ambulatory Visit: Payer: Medicare HMO | Admitting: Urology

## 2022-06-18 VITALS — BP 148/79 | HR 64 | Ht 66.0 in | Wt 164.0 lb

## 2022-06-18 DIAGNOSIS — R3121 Asymptomatic microscopic hematuria: Secondary | ICD-10-CM

## 2022-06-18 MED ORDER — LIDOCAINE HCL URETHRAL/MUCOSAL 2 % EX GEL
1.0000 | Freq: Once | CUTANEOUS | Status: AC
  Administered 2022-06-18: 1 via URETHRAL

## 2022-06-18 NOTE — Addendum Note (Signed)
Addended by: Frankey Shown on: 06/18/2022 09:42 AM   Modules accepted: Orders

## 2022-06-18 NOTE — Progress Notes (Signed)
Cystoscopy Procedure Note:  Indication: Microscopic hematuria  After informed consent and discussion of the procedure and its risks, Susan York was positioned and prepped in the standard fashion. Cystoscopy was performed with a flexible cystoscope. The urethra, bladder neck and entire bladder was visualized in a standard fashion. The ureteral orifices were visualized in their normal location and orientation.  Bladder mucosa grossly normal throughout, no abnormalities, no abnormalities on retroflexion.  Imaging: CT abdomen and pelvis with contrast dated 05/09/2022 with no urologic abnormalities  Findings: Normal cystoscopy  Assessment and Plan: Follow-up with urology as needed  Legrand Rams, MD 06/18/2022

## 2022-08-28 ENCOUNTER — Other Ambulatory Visit: Payer: Self-pay | Admitting: Student

## 2022-08-28 DIAGNOSIS — R42 Dizziness and giddiness: Secondary | ICD-10-CM

## 2022-09-12 ENCOUNTER — Ambulatory Visit
Admission: RE | Admit: 2022-09-12 | Discharge: 2022-09-12 | Disposition: A | Discharge status: Home / Self Care | Payer: Medicare HMO | Source: Ambulatory Visit | Attending: Student | Admitting: Student

## 2022-09-12 DIAGNOSIS — R42 Dizziness and giddiness: Secondary | ICD-10-CM

## 2022-09-12 MED ORDER — GADOPICLENOL 0.5 MMOL/ML IV SOLN
7.5000 mL | Freq: Once | INTRAVENOUS | Status: AC | PRN
  Administered 2022-09-12: 7.5 mL via INTRAVENOUS

## 2022-10-17 ENCOUNTER — Other Ambulatory Visit: Payer: Self-pay | Admitting: Family Medicine

## 2022-10-17 DIAGNOSIS — Z1231 Encounter for screening mammogram for malignant neoplasm of breast: Secondary | ICD-10-CM

## 2022-11-06 ENCOUNTER — Ambulatory Visit
Admission: RE | Admit: 2022-11-06 | Discharge: 2022-11-06 | Disposition: A | Discharge status: Home / Self Care | Payer: Medicare HMO | Source: Ambulatory Visit | Attending: Family Medicine | Admitting: Family Medicine

## 2022-11-06 DIAGNOSIS — Z1231 Encounter for screening mammogram for malignant neoplasm of breast: Secondary | ICD-10-CM | POA: Diagnosis not present

## 2023-09-30 ENCOUNTER — Other Ambulatory Visit: Payer: Self-pay | Admitting: Family Medicine

## 2023-09-30 DIAGNOSIS — Z1231 Encounter for screening mammogram for malignant neoplasm of breast: Secondary | ICD-10-CM

## 2023-11-10 ENCOUNTER — Ambulatory Visit

## 2023-12-01 ENCOUNTER — Ambulatory Visit
Admission: RE | Admit: 2023-12-01 | Discharge: 2023-12-01 | Disposition: A | Discharge status: Home / Self Care | Source: Ambulatory Visit | Attending: Family Medicine | Admitting: Family Medicine

## 2023-12-01 DIAGNOSIS — Z1231 Encounter for screening mammogram for malignant neoplasm of breast: Secondary | ICD-10-CM | POA: Diagnosis present
